# Patient Record
Sex: Female | Born: 1990 | Race: White | Hispanic: No | Marital: Married | Smoking: Never smoker
Health system: Southern US, Community
[De-identification: ages and names within clinical notes are randomized; demographics above are authoritative.]

## PROBLEM LIST (undated history)

## (undated) DIAGNOSIS — J45909 Unspecified asthma, uncomplicated: Secondary | ICD-10-CM

## (undated) DIAGNOSIS — G43909 Migraine, unspecified, not intractable, without status migrainosus: Secondary | ICD-10-CM

## (undated) HISTORY — DX: Migraine, unspecified, not intractable, without status migrainosus: G43.909

## (undated) HISTORY — DX: Unspecified asthma, uncomplicated: J45.909

---
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
## 2010-03-12 ENCOUNTER — Ambulatory Visit: Payer: Self-pay | Admitting: Family Medicine

## 2010-03-12 DIAGNOSIS — N946 Dysmenorrhea, unspecified: Secondary | ICD-10-CM | POA: Insufficient documentation

## 2010-03-12 DIAGNOSIS — G43109 Migraine with aura, not intractable, without status migrainosus: Secondary | ICD-10-CM | POA: Insufficient documentation

## 2010-03-12 LAB — CONVERTED CEMR LAB
Bilirubin Urine: NEGATIVE
Glucose, Urine, Semiquant: NEGATIVE
Ketones, urine, test strip: NEGATIVE
Nitrite: NEGATIVE
Protein, U semiquant: NEGATIVE
Specific Gravity, Urine: 1.015
Urobilinogen, UA: 0.2
WBC Urine, dipstick: NEGATIVE
pH: 6.5

## 2010-03-13 LAB — CONVERTED CEMR LAB
ALT: 12 units/L (ref 0–35)
AST: 18 units/L (ref 0–37)
Albumin: 4.4 g/dL (ref 3.5–5.2)
Alkaline Phosphatase: 46 units/L (ref 39–117)
BUN: 12 mg/dL (ref 6–23)
Basophils Absolute: 0 10*3/uL (ref 0.0–0.1)
Basophils Relative: 0.5 % (ref 0.0–3.0)
Bilirubin, Direct: 0.2 mg/dL (ref 0.0–0.3)
CO2: 28 meq/L (ref 19–32)
Calcium: 9.1 mg/dL (ref 8.4–10.5)
Chloride: 104 meq/L (ref 96–112)
Cholesterol: 114 mg/dL (ref 0–200)
Creatinine, Ser: 0.9 mg/dL (ref 0.4–1.2)
Eosinophils Absolute: 0.2 10*3/uL (ref 0.0–0.7)
Eosinophils Relative: 3.7 % (ref 0.0–5.0)
GFR calc non Af Amer: 86.04 mL/min (ref >60)
Glucose, Bld: 85 mg/dL (ref 70–99)
HCT: 39.4 % (ref 36.0–46.0)
HDL: 40.2 mg/dL (ref >39.00)
Hemoglobin: 13.6 g/dL (ref 12.0–15.0)
LDL Cholesterol: 68 mg/dL (ref 0–99)
Lymphocytes Relative: 24.1 % (ref 12.0–46.0)
Lymphs Abs: 1.4 10*3/uL (ref 0.7–4.0)
MCHC: 34.5 g/dL (ref 30.0–36.0)
MCV: 89.7 fL (ref 78.0–100.0)
Monocytes Absolute: 0.5 10*3/uL (ref 0.1–1.0)
Monocytes Relative: 8.7 % (ref 3.0–12.0)
Neutro Abs: 3.6 10*3/uL (ref 1.4–7.7)
Neutrophils Relative %: 63 % (ref 43.0–77.0)
Platelets: 245 10*3/uL (ref 150.0–400.0)
Potassium: 4.1 meq/L (ref 3.5–5.1)
RBC: 4.39 M/uL (ref 3.87–5.11)
RDW: 13.4 % (ref 11.5–14.6)
Sodium: 139 meq/L (ref 135–145)
TSH: 0.8 microintl units/mL (ref 0.35–5.50)
Total Bilirubin: 1.1 mg/dL (ref 0.3–1.2)
Total CHOL/HDL Ratio: 3
Total Protein: 7 g/dL (ref 6.0–8.3)
Triglycerides: 29 mg/dL (ref 0.0–149.0)
VLDL: 5.8 mg/dL (ref 0.0–40.0)
WBC: 5.7 10*3/uL (ref 4.5–10.5)

## 2010-03-25 ENCOUNTER — Encounter (INDEPENDENT_AMBULATORY_CARE_PROVIDER_SITE_OTHER): Payer: Self-pay | Admitting: *Deleted

## 2010-05-04 ENCOUNTER — Encounter: Payer: Self-pay | Admitting: Family Medicine

## 2010-11-17 NOTE — Assessment & Plan Note (Signed)
Summary: CPX for Nursing school/scm   Vital Signs:  Patient profile:   20 year old female Height:      65 inches Weight:      135 pounds BMI:     22.55 Pulse rate:   76 / minute Pulse rhythm:   regular Resp:     12 per minute BP sitting:   120 / 76  (left arm) Cuff size:   regular  Vitals Entered By: Army Fossa CMA (Mar 12, 2010 1:07 PM) CC: Pt here for CPX  Vision Screening:Left eye w/o correction: 20 / 15 Right Eye w/o correction: 20 / 13 Both eyes w/o correction:  20/ 10        Vision Entered By: Army Fossa CMA (Mar 12, 2010 1:20 PM)   History of Present Illness: Pt here for cpe and labs.  No complaints.  Pt needs forms filled out for [employer] [employer]- nursing school.  Pt had headache with ? aura on bcp so gyn stopped bcp until seen by neurologist.   No pap -- pt sees [facility] ob[facility]yn.  Preventive Screening-Counseling & Management  Alcohol-Tobacco     Alcohol drinks/day: 0     Smoking Status: never  Caffeine-Diet-Exercise     Caffeine use/day: 2     Does Patient Exercise: yes     Type of exercise: gym     Exercise (avg: min/session): 30-60     Times/week: <3  Hep-HIV-STD-Contraception     Dental Visit-last 6 months yes     Dental Care Counseling: not indicated; dental care within six months      Sexual History:  not sexually active.        Drug Use:  no.    Current Medications (verified): 1)  Ventolin Hfa 108 (90 Base) Mcg/act Aers (Albuterol Sulfate)  Allergies (verified): 1)  ! 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 Family History Diabetes 1st degree relative Family History High cholesterol Family History Hypertension  Social History: Reviewed history and no changes required. Single Never Smoked Alcohol use-no Drug use-no Regular exercise-yes Smoking Status:  never Drug Use:  no Does Patient Exercise:  yes Caffeine use/day:  2 Dental Care w/in 6 mos.:  yes Sexual History:  not sexually active  Review of Systems      See HPI General:  Denies chills, fatigue, fever, loss of appetite, malaise, sleep disorder, sweats, weakness, and weight loss. Eyes:  Denies blurring, discharge, double vision, eye irritation, eye pain, halos, itching, light sensitivity, red eye, vision loss-1 eye, and vision loss-both eyes; optho q1y. ENT:  Denies decreased hearing, difficulty swallowing, ear discharge, earache, hoarseness, nasal congestion, nosebleeds, postnasal drainage, ringing in ears, sinus pressure, and sore throat. CV:  Denies bluish discoloration of lips or nails, chest pain or discomfort, difficulty breathing at night, difficulty breathing while lying down, fainting, fatigue, leg cramps with exertion, lightheadness, near fainting, palpitations, shortness of breath with exertion, swelling of feet, swelling of hands, and weight gain. Resp:  Denies chest discomfort, chest pain with inspiration, cough,  coughing up blood, excessive snoring, hypersomnolence, morning headaches, pleuritic, shortness of breath, sputum productive, and wheezing. GI:  Denies abdominal pain, bloody stools, change in bowel habits, constipation, dark tarry stools, diarrhea, excessive appetite, gas, hemorrhoids, indigestion, loss of appetite, and nausea. GU:  Denies abnormal vaginal bleeding, decreased libido, discharge, dysuria,  genital sores, hematuria, incontinence, nocturia, urinary frequency, and urinary hesitancy. MS:  Denies joint pain, joint redness, joint swelling, loss of strength, low back pain, mid back pain, muscle aches, muscle , cramps, muscle weakness, stiffness, and thoracic pain. Derm:  Denies changes in color of skin, changes in nail beds, dryness, excessive perspiration, flushing, hair loss, insect bite(s), itching, lesion(s), poor wound healing, and rash; derm-- Dr Pablo Lawrence --[facility]. Neuro:  Denies brief paralysis, difficulty with concentration, disturbances in coordination, falling down, headaches, inability to speak, memory loss, numbness, poor balance, seizures, sensation of room spinning, tingling, tremors, visual disturbances, and weakness. Psych:  Denies alternate hallucination ( auditory/visual), anxiety, depression, easily angered, easily tearful, irritability, mental problems, panic attacks, sense of great danger, suicidal thoughts/plans, thoughts of violence, unusual visions or sounds, and thoughts /plans of harming others. Endo:  Denies cold intolerance, excessive hunger, excessive thirst, excessive urination, heat intolerance, polyuria, and weight change. Heme:  Denies abnormal bruising, bleeding, enlarge lymph nodes, fevers, pallor, and skin discoloration. Allergy:  Denies hives or rash, itching eyes, persistent infections, seasonal allergies, and sneezing.  Physical Exam  General:  Well-developed,well-nourished,in no acute distress; alert,appropriate and cooperative throughout examination Head:  Normocephalic and atraumatic without obvious abnormalities. No apparent alopecia or balding. Eyes:  vision grossly intact, pupils equal, pupils round, pupils reactive to light, and no injection.   Ears:  External ear exam shows no significant lesions or deformities.  Otoscopic examination reveals clear canals, tympanic membranes are intact bilaterally without bulging, retraction, inflammation or  discharge. Hearing is grossly normal bilaterally. Nose:  External nasal examination shows no deformity or inflammation. Nasal mucosa are pink and moist without lesions or exudates. Mouth:  Oral mucosa and oropharynx without lesions or exudates.  Teeth in good repair. Neck:  No deformities, masses, or tenderness noted. Chest Wall:  No deformities, masses, or tenderness noted. Breasts:  gyn Lungs:  Normal respiratory effort, chest expands symmetrically. Lungs are clear to auscultation, no crackles or wheezes. Heart:  Normal rate and regular rhythm. S1 and S2 normal without gallop, murmur, click, rub or other extra sounds. Abdomen:  Bowel sounds positive,abdomen soft and non-tender without masses, organomegaly or hernias noted. Genitalia:  gyn Msk:  normal ROM, no joint swelling, no joint warmth, no redness over joints, no joint instability, and no crepitation.   Pulses:  R posterior tibial normal, R dorsalis pedis normal, R carotid normal, L popliteal normal, L posterior tibial normal, and L dorsalis pedis normal.   Extremities:  No clubbing, cyanosis, edema, or deformity noted with normal full range of motion of all joints.   Neurologic:  No cranial nerve deficits noted. Station and gait are normal. Plantar reflexes are down-going bilaterally. DTRs are symmetrical throughout. Sensory, motor and coordinative functions appear intact. Skin:  Intact without suspicious lesions or rashes Cervical Nodes:  No lymphadenopathy noted Axillary Nodes:  No palpable lymphadenopathy Psych:  Cognition and judgment appear intact. Alert and cooperative with normal attention span and concentration. 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 Gravity: 1.015   (Normal Range: 1.003-1.035) Blood: moderate   (Normal Range: Negative) pH: 6.5   (Normal Range: 5.0-8.0) Protein: negative   (Normal Range: Negative) Urobilinogen: 0.2   (Normal Range: 0-1) Nitrite: negative   (Normal Range: Negative) Leukocyte Esterace: negative   (Normal Range: Negative)    Comments: Pt is on period. Army Fossa CMA  Mar 12, 2010 1:19 PM       Immunization History:  Tetanus/Td Immunization History:    Tetanus/Td:  Tdap (06/26/2008)  Influenza Immunization History:    Influenza:  Fluvax 3+ (07/20/2009)  PPD Status History:    PPD Status:  negative (01/04/2010)

## 2010-11-17 NOTE — Miscellaneous (Signed)
Summary: Vaccine Record  Vaccine Record   Imported By: Lanelle Bal 03/21/2010 08:38:57  _____________________________________________________________________  External Attachment:    Type:   Image     Comment:   External Document

## 2010-11-17 NOTE — Consult Note (Signed)
Summary: [facility]  [facility]   Imported By: Lanelle Bal 05/12/2010 09:40:46  _____________________________________________________________________  External Attachment:    Type:   Image     Comment:   External Document

## 2010-11-17 NOTE — Letter (Signed)
Summary: Primary Care Consult Scheduled Letter  [facility] [place]  [address] [place], [place] [address]   Phone: [phone]  Fax: [phone]      03/25/2010 MRN: [id number]  Selena Campbell [address] [place], [place]  [address]    Dear [name],      We have scheduled an appointment for you.  At the recommendation of Dr._Lowne_, we have scheduled you a consult with _Dr. Yan___ on _July 7, 2011_ at _10:45am__.  Their address is_[address] [place] [address]___. The office phone number is _[phone]__.  If this appointment day and time is not convenient for you, please feel free to call the office of the doctor you are being referred to at the number listed above and reschedule the appointment.     It is important for you to keep your scheduled appointments. We are here to make sure you are given good patient care. If you have questions or you have made changes to your appointment, please notify us at  [phone]  , ask for  [name]    .  Also please call and update your phone numbers, we were unable to reach you by phone.     Thank you,  Patient Care Coordinator [place] at [facility]

## 2016-10-05 ENCOUNTER — Emergency Department (HOSPITAL_BASED_OUTPATIENT_CLINIC_OR_DEPARTMENT_OTHER)
Admission: EM | Admit: 2016-10-05 | Discharge: 2016-10-06 | Disposition: A | Discharge status: Home / Self Care | Payer: 59 | Attending: Emergency Medicine | Admitting: Emergency Medicine

## 2016-10-05 ENCOUNTER — Encounter (HOSPITAL_BASED_OUTPATIENT_CLINIC_OR_DEPARTMENT_OTHER): Payer: Self-pay | Admitting: *Deleted

## 2016-10-05 ENCOUNTER — Emergency Department (HOSPITAL_BASED_OUTPATIENT_CLINIC_OR_DEPARTMENT_OTHER): Payer: 59

## 2016-10-05 DIAGNOSIS — Z8619 Personal history of other infectious and parasitic diseases: Secondary | ICD-10-CM | POA: Diagnosis not present

## 2016-10-05 DIAGNOSIS — R161 Splenomegaly, not elsewhere classified: Secondary | ICD-10-CM | POA: Insufficient documentation

## 2016-10-05 DIAGNOSIS — J45909 Unspecified asthma, uncomplicated: Secondary | ICD-10-CM | POA: Diagnosis not present

## 2016-10-05 DIAGNOSIS — R1011 Right upper quadrant pain: Secondary | ICD-10-CM

## 2016-10-05 LAB — CBC WITH DIFFERENTIAL/PLATELET
BASOS ABS: 0 10*3/uL (ref 0.0–0.1)
Basophils Relative: 0 %
EOS ABS: 0.1 10*3/uL (ref 0.0–0.7)
EOS PCT: 2 %
HCT: 35.9 % — ABNORMAL LOW (ref 36.0–46.0)
Hemoglobin: 12.3 g/dL (ref 12.0–15.0)
Lymphocytes Relative: 35 %
Lymphs Abs: 1.1 10*3/uL (ref 0.7–4.0)
MCH: 28.1 pg (ref 26.0–34.0)
MCHC: 34.3 g/dL (ref 30.0–36.0)
MCV: 82.2 fL (ref 78.0–100.0)
Monocytes Absolute: 0.5 10*3/uL (ref 0.1–1.0)
Monocytes Relative: 15 %
Neutro Abs: 1.5 10*3/uL — ABNORMAL LOW (ref 1.7–7.7)
Neutrophils Relative %: 48 %
PLATELETS: 202 10*3/uL (ref 150–400)
RBC: 4.37 MIL/uL (ref 3.87–5.11)
RDW: 13 % (ref 11.5–15.5)
WBC: 3.1 10*3/uL — AB (ref 4.0–10.5)

## 2016-10-05 LAB — PREGNANCY, URINE: PREG TEST UR: NEGATIVE

## 2016-10-05 LAB — COMPREHENSIVE METABOLIC PANEL
ALBUMIN: 4.3 g/dL (ref 3.5–5.0)
ALT: 16 U/L (ref 14–54)
AST: 20 U/L (ref 15–41)
Alkaline Phosphatase: 54 U/L (ref 38–126)
Anion gap: 8 (ref 5–15)
BUN: 6 mg/dL (ref 6–20)
CHLORIDE: 103 mmol/L (ref 101–111)
CO2: 24 mmol/L (ref 22–32)
CREATININE: 0.71 mg/dL (ref 0.44–1.00)
Calcium: 9 mg/dL (ref 8.9–10.3)
GFR calc non Af Amer: >60 mL/min (ref >60)
Glucose, Bld: 96 mg/dL (ref 65–99)
Potassium: 3.5 mmol/L (ref 3.5–5.1)
SODIUM: 135 mmol/L (ref 135–145)
Total Bilirubin: 1.4 mg/dL — ABNORMAL HIGH (ref 0.3–1.2)
Total Protein: 7 g/dL (ref 6.5–8.1)

## 2016-10-05 LAB — LIPASE, BLOOD: Lipase: 29 U/L (ref 11–51)

## 2016-10-05 MED ORDER — DICYCLOMINE HCL 20 MG PO TABS: 20.0000 mg | ORAL_TABLET | Freq: Two times a day (BID) | ORAL | 0 refills | Status: DC

## 2016-10-05 MED ORDER — NAPROXEN 500 MG PO TABS: 500.0000 mg | ORAL_TABLET | Freq: Two times a day (BID) | ORAL | 0 refills | Status: DC

## 2016-10-05 MED ORDER — SODIUM CHLORIDE 0.9 % IV BOLUS (SEPSIS)
1000.0000 mL | Freq: Once | INTRAVENOUS | Status: AC
  Administered 2016-10-05: 1000 mL via INTRAVENOUS

## 2016-10-05 MED ORDER — GI COCKTAIL ~~LOC~~
30.0000 mL | Freq: Once | ORAL | Status: AC
  Administered 2016-10-05: 30 mL via ORAL
  Filled 2016-10-05: qty 30

## 2016-10-05 MED ORDER — ONDANSETRON HCL 4 MG/2ML IJ SOLN
4.0000 mg | Freq: Once | INTRAMUSCULAR | Status: AC
  Administered 2016-10-05: 4 mg via INTRAVENOUS
  Filled 2016-10-05: qty 2

## 2016-10-05 MED ORDER — PROMETHAZINE HCL 25 MG PO TABS: 25.0000 mg | ORAL_TABLET | Freq: Four times a day (QID) | ORAL | 0 refills | Status: DC | PRN

## 2016-10-05 NOTE — ED Triage Notes (Signed)
Pt reports that she has had nausea last night.  States that she has not had fever.  Reports right shoulder pain.  [facility] for [facility].  Pt on her period now-states that she was 5 days late starting and that she has a 499 month old.  Ambulatory.

## 2016-10-05 NOTE — Discharge Instructions (Signed)
Follow-up with your primary care doctor for further evaluation of your symptoms. You may take Bentyl as prescribed for abdominal pain and spasms as well as Phenergan for nausea, as needed. Your ultrasound today did not show any abnormalities with your gallbladder. It did show an enlarged spleen. This may be from your history of mononucleosis. Your electrolytes, liver function, and kidney function were reassuring. Return to the Emergency Department if you experience new or concerning symptoms or worsening of your pain.

## 2016-10-05 NOTE — ED Provider Notes (Signed)
[facility] Provider Note   CSN: [id number] Arrival date & time: 10/05/16  2038  By signing my name below, I, Clovis PuAvnee Patel, attest that this documentation has been prepared under the direction and in the presence of  [employer]Kelly Eilan Mcinerny, PA-C. Electronically Signed: Clovis PuAvnee Patel, ED Scribe. 10/05/16. 9:48 PM.   History   Chief Complaint Chief Complaint  Patient presents with  . Abdominal Pain    The history is provided by the patient. No language interpreter was used.   HPI Comments:  Selena Campbell is a 25 y.o. female who presents to the Emergency Department complaining of sudden onset, worsening, right sided abdominal pain x yesterday. Pt states her pain worsened after eating fried squash. She also reports nausea, body aches and right shoulder pain. She visited Urgent Care at 7 PM today and was referred to the ED. Pt denies diarrhea, blood in her stool, vomiting, fevers any other associated symptoms and modifying factors at this time. She notes a family hx of gallbladder issues with aunt, maternal grandmother and paternal grandmother. Pt is currently on the last day of her menstrual cycle which was a couple of days late.     History reviewed. No pertinent past medical history.  Patient Active Problem List   Diagnosis Date Noted  . MIGRAINE WITH AURA 03/12/2010  . DYSMENORRHEA 03/12/2010    Past Surgical History:  Procedure Laterality Date  . CESAREAN SECTION      OB History    No data available       Home Medications    Prior to Admission medications   Medication Sig Start Date End Date Taking? Authorizing Provider  dicyclomine (BENTYL) 20 MG tablet Take 1 tablet (20 mg total) by mouth 2 (two) times daily. Take, as needed for abdominal pain/cramping 10/05/16   Antony MaduraKelly Ruthanna Macchia, PA-C  naproxen (NAPROSYN) 500 MG tablet Take 1 tablet (500 mg total) by mouth 2 (two) times daily. 10/05/16   Antony MaduraKelly Ellyce Lafevers, PA-C  promethazine (PHENERGAN) 25 MG tablet Take 1 tablet (25 mg total)  by mouth every 6 (six) hours as needed for nausea or vomiting. 10/05/16   Antony MaduraKelly Arria Naim, PA-C    Family History History reviewed. No pertinent family history.  Social History Social History  Substance Use Topics  . Smoking status: Never Smoker  . Smokeless tobacco: Never Used  . Alcohol use No     Allergies   Prednisone   Review of Systems Review of Systems 10 systems reviewed and all are negative for acute change except as noted in the HPI.   Physical Exam Updated Vital Signs BP 104/65 (BP Location: Left Arm)   Pulse 75   Temp 98.8 F (37.1 C) (Oral)   Resp 18   Ht 5\' 6"  (1.676 m)   Wt 59 kg   LMP 09/30/2016   SpO2 98%   BMI 20.98 kg/m   Physical Exam  Constitutional: She is oriented to person, place, and time. She appears well-developed and well-nourished. No distress.  Nontoxic and in no distress  HENT:  Head: Normocephalic and atraumatic.  Eyes: Conjunctivae and EOM are normal. No scleral icterus.  Neck: Normal range of motion.  Cardiovascular: Normal rate, regular rhythm and intact distal pulses.   Pulmonary/Chest: Effort normal. No respiratory distress. She has no wheezes. She has no rales.  Lungs clear to auscultation bilaterally  Abdominal: Soft. She exhibits no distension and no mass. There is tenderness. There is no rebound.  Soft, nondistended abdomen. There is focal right upper quadrant  tenderness. Negative Murphy sign. There is also mild suprapubic tenderness. No masses or peritoneal signs.   Musculoskeletal: Normal range of motion.  Neurological: She is alert and oriented to person, place, and time. She exhibits normal muscle tone. Coordination normal.  GCS 15. Patient moving all extremities.  Skin: Skin is warm and dry. No rash noted. She is not diaphoretic. No erythema. No pallor.  Psychiatric: She has a normal mood and affect. Her behavior is normal.  Nursing note and vitals reviewed.    ED Treatments / Results  DIAGNOSTIC STUDIES:  Oxygen  Saturation is 100% on RA, normal by my interpretation.    COORDINATION OF CARE:  9:47 PM Discussed treatment plan with pt at bedside and pt agreed to plan.\  Labs (all labs ordered are listed, but only abnormal results are displayed) Labs Reviewed  CBC WITH DIFFERENTIAL/PLATELET - Abnormal; Notable for the following:       Result Value   WBC 3.1 (*)    HCT 35.9 (*)    Neutro Abs 1.5 (*)    All other components within normal limits  COMPREHENSIVE METABOLIC PANEL - Abnormal; Notable for the following:    Total Bilirubin 1.4 (*)    All other components within normal limits  LIPASE, BLOOD  PREGNANCY, URINE    EKG  EKG Interpretation None       Radiology [place] Abdomen Complete  Result Date: 10/05/2016 CLINICAL DATA:  Nausea vomiting and right upper quadrant pain for 2 days EXAM: ABDOMEN ULTRASOUND COMPLETE COMPARISON:  None. FINDINGS: Gallbladder: No gallstones or wall thickening visualized. No sonographic Murphy sign noted by sonographer. Common bile duct: Diameter: 2.1 mm Liver: No focal lesion identified. Within normal limits in parenchymal echogenicity. IVC: No abnormality visualized. Pancreas: Visualized portion unremarkable. Spleen: Enlarged at 13 cm with volume of 424 mL Right Kidney: Length: 10.6 cm. Echogenicity within normal limits. No mass or hydronephrosis visualized. Left Kidney: Length: 10.3 cm. Echogenicity within normal limits. No mass or hydronephrosis visualized. Abdominal aorta: No aneurysm visualized. Other findings: None. IMPRESSION: 1. Enlarged spleen. 2. Otherwise negative abdominal ultrasound Electronically Signed   By: Jasmine PangKim  Fujinaga M.D.   On: 10/05/2016 22:33    Procedures Procedures (including critical care time)  Medications Ordered in ED Medications  gi cocktail (Maalox,Lidocaine,Donnatal) (30 mLs Oral Given 10/05/16 2229)  sodium chloride 0.9 % bolus 1,000 mL (0 mLs Intravenous Stopped 10/06/16 0001)  ondansetron (ZOFRAN) injection 4 mg (4 mg  Intravenous Given 10/05/16 2229)     Initial Impression / Assessment and Plan / ED Course  I have reviewed the triage vital signs and the nursing notes.  Pertinent labs & imaging results that were available during my care of the patient were reviewed by me and considered in my medical decision making (see chart for details).  Clinical Course     25 year old female presents to the emergency department for evaluation of intermittent right upper quadrant abdominal pain. Symptoms worsened yesterday evening and were largely resolved by the morning; however, patient has continued to have a general abdominal ache. No signs of acute surgical abdomen on exam. No vomiting, diarrhea, melena, or hematochezia. Patient is afebrile today. She has a mild leukopenia. Liver and kidney function preserved.  Given focal right upper quadrant tenderness, ultrasound obtained. This shows splenomegaly only. No other acute or concerning process. No evidence of acute cholecystitis. Patient reports a history of mononucleosis. It is possible that her splenomegaly is chronic. I have recommended that she follow-up with her primary care doctor to  have this reassessed. Patient did have some mild suprapubic tenderness. This is suspected to be secondary to her menstrual cycle. She has no complaints of lower abdominal pain. Abdomen is stable on repeat assessment. I do not believe further emergent workup is indicated. Patient discharged with instructions for supportive care. Return precautions discussed and provided. Patient discharged in satisfactory condition with no unaddressed concerns.   Final Clinical Impressions(s) / ED Diagnoses   Final diagnoses:  Right upper quadrant abdominal pain  Splenomegaly  History of Epstein-Barr virus infection    New Prescriptions Discharge Medication List as of 10/05/2016 11:36 PM    START taking these medications   Details  dicyclomine (BENTYL) 20 MG tablet Take 1 tablet (20 mg total)  by mouth 2 (two) times daily. Take, as needed for abdominal pain/cramping, Starting Tue 10/05/2016, Print    naproxen (NAPROSYN) 500 MG tablet Take 1 tablet (500 mg total) by mouth 2 (two) times daily., Starting Tue 10/05/2016, Print    promethazine (PHENERGAN) 25 MG tablet Take 1 tablet (25 mg total) by mouth every 6 (six) hours as needed for nausea or vomiting., Starting Tue 10/05/2016, Print        I personally performed the services described in this documentation, which was scribed in my presence. The recorded information has been reviewed and is accurate.       Antony Madura, PA-C 10/06/16 0049    Melene Plan, DO 10/06/16 2121

## 2016-10-08 ENCOUNTER — Encounter: Payer: Self-pay | Admitting: Family

## 2016-10-08 ENCOUNTER — Ambulatory Visit (INDEPENDENT_AMBULATORY_CARE_PROVIDER_SITE_OTHER): Payer: 59 | Admitting: Family

## 2016-10-08 VITALS — BP 110/66 | HR 93 | Temp 99.7°F | Resp 16 | Ht 66.0 in | Wt 127.2 lb

## 2016-10-08 DIAGNOSIS — Z23 Encounter for immunization: Secondary | ICD-10-CM

## 2016-10-08 DIAGNOSIS — R161 Splenomegaly, not elsewhere classified: Secondary | ICD-10-CM | POA: Diagnosis not present

## 2016-10-08 LAB — MONONUCLEOSIS SCREEN: MONO SCREEN: NEGATIVE

## 2016-10-08 NOTE — Patient Instructions (Addendum)
Please call if your abdominal symptoms do not continue to improve.  Schedule a complete physical at your convenience.      Influenza (Flu) Vaccine (Inactivated or Recombinant): What You Need to Know 1. Why get vaccinated? Influenza ("flu") is a contagious disease that spreads around the [place] every year, usually between October and May. Flu is caused by influenza viruses, and is spread mainly by coughing, sneezing, and close contact. Anyone can get flu. Flu strikes suddenly and can last several days. Symptoms vary by age, but can include:  fever/chills  sore throat  muscle aches  fatigue  cough  headache  runny or stuffy nose Flu can also lead to pneumonia and blood infections, and cause diarrhea and seizures in children. If you have a medical condition, such as heart or lung disease, flu can make it worse. Flu is more dangerous for some people. Infants and young children, people 27 years of age and older, pregnant women, and people with certain health conditions or a weakened immune system are at greatest risk. Each year thousands of people in the [place] die from flu, and many more are hospitalized. Flu vaccine can:  keep you from getting flu,  make flu less severe if you do get it, and  keep you from spreading flu to your family and other people. 2. Inactivated and recombinant flu vaccines A dose of flu vaccine is recommended every flu season. Children 6 months through 968 years of age may need two doses during the same flu season. Everyone else needs only one dose each flu season. Some inactivated flu vaccines contain a very small amount of a mercury-based preservative called thimerosal. Studies have not shown thimerosal in vaccines to be harmful, but flu vaccines that do not contain thimerosal are available. There is no live flu virus in flu shots. They cannot cause the flu. There are many flu viruses, and they are always changing. Each year a new flu vaccine  is made to protect against three or four viruses that are likely to cause disease in the upcoming flu season. But even when the vaccine doesn't exactly match these viruses, it may still provide some protection. Flu vaccine cannot prevent:  flu that is caused by a virus not covered by the vaccine, or  illnesses that look like flu but are not. It takes about 2 weeks for protection to develop after vaccination, and protection lasts through the flu season. 3. Some people should not get this vaccine Tell the person who is giving you the vaccine:  If you have any severe, life-threatening allergies. If you ever had a life-threatening allergic reaction after a dose of flu vaccine, or have a severe allergy to any part of this vaccine, you may be advised not to get vaccinated. Most, but not all, types of flu vaccine contain a small amount of egg protein.  If you ever had Guillain-Barr Syndrome (also called GBS). Some people with a history of GBS should not get this vaccine. This should be discussed with your doctor.  If you are not feeling well. It is usually okay to get flu vaccine when you have a mild illness, but you might be asked to come back when you feel better. 4. Risks of a vaccine reaction With any medicine, including vaccines, there is a chance of reactions. These are usually mild and go away on their own, but serious reactions are also possible. Most people who get a flu shot do not have any problems with it.  Minor problems following a flu shot include:  soreness, redness, or swelling where the shot was given  hoarseness  sore, red or itchy eyes  cough  fever  aches  headache  itching  fatigue If these problems occur, they usually begin soon after the shot and last 1 or 2 days. More serious problems following a flu shot can include the following:  There may be a small increased risk of Guillain-Barre Syndrome (GBS) after inactivated flu vaccine. This risk has been estimated  at 1 or 2 additional cases per million people vaccinated. This is much lower than the risk of severe complications from flu, which can be prevented by flu vaccine.  Young children who get the flu shot along with pneumococcal vaccine (PCV13) and/or DTaP vaccine at the same time might be slightly more likely to have a seizure caused by fever. Ask your doctor for more information. Tell your doctor if a child who is getting flu vaccine has ever had a seizure. Problems that could happen after any injected vaccine:  People sometimes faint after a medical procedure, including vaccination. Sitting or lying down for about 15 minutes can help prevent fainting, and injuries caused by a fall. Tell your doctor if you feel dizzy, or have vision changes or ringing in the ears.  Some people get severe pain in the shoulder and have difficulty moving the arm where a shot was given. This happens very rarely.  Any medication can cause a severe allergic reaction. Such reactions from a vaccine are very rare, estimated at about 1 in a million doses, and would happen within a few minutes to a few hours after the vaccination. As with any medicine, there is a very remote chance of a vaccine causing a serious injury or death. The safety of vaccines is always being monitored. For more information, visit: http://floyd.org/www.cdc.gov/vaccinesafety/ 5. What if there is a serious reaction? What should I look for? Look for anything that concerns you, such as signs of a severe allergic reaction, very high fever, or unusual behavior. Signs of a severe allergic reaction can include hives, swelling of the face and throat, difficulty breathing, a fast heartbeat, dizziness, and weakness. These would start a few minutes to a few hours after the vaccination. What should I do?  If you think it is a severe allergic reaction or other emergency that can't wait, call 9-1-1 and get the person to the nearest hospital. Otherwise, call your doctor.  Reactions  should be reported to the Vaccine Adverse Event Reporting System (VAERS). Your doctor should file this report, or you can do it yourself through the VAERS web site at www.vaers.LAgents.nohhs.gov, or by calling 1-(401)357-3067.  VAERS does not give medical advice. 6. The National Vaccine Injury Compensation Program The [employer] Vaccine Injury Compensation Program (VICP) is a federal program that was created to compensate people who may have been injured by certain vaccines. Persons who believe they may have been injured by a vaccine can learn about the program and about filing a claim by calling 1-778-212-4411 or visiting the VICP website at SpiritualWord.atwww.hrsa.gov/vaccinecompensation. There is a time limit to file a claim for compensation. 7. How can I learn more?  Ask your healthcare provider. He or she can give you the vaccine package insert or suggest other sources of information.  Call your local or state health department.  Contact the Centers for Disease Control and Prevention (CDC):  Call 225-003-03271-503-246-7586 (1-800-CDC-INFO) or  Visit CDC's website at BiotechRoom.com.cywww.cdc.gov/flu Vaccine Information Statement, Inactivated Influenza Vaccine (  CDC's website at www.cdc.gov/flu Vaccine Information Statement, Inactivated Influenza Vaccine (05/24/2014) This information is not intended to replace advice given to you by your health care provider. Make sure you discuss any questions you have with your health care provider. Document Released: 07/29/2006 Document Revised: 06/24/2016 Document Reviewed: 06/24/2016 Elsevier Interactive Patient Education  2017 Elsevier Inc.  

## 2016-10-08 NOTE — Progress Notes (Signed)
Subjective:    Patient ID: Selena Campbell, female    DOB: 1991-02-14, 25 y.o.   MRN: [id number]  HPI  [name] is a 25 yr old female who presents today to establish care.    ED record is reviewed.  She presented with R sided abdominal pain.  She had mild elevation of her total bilirubin. Platelets were normal. She reports that she had mono at age 89.  She reports that her abdominal pain is improved.  Ate solids for the first time yesterday.  She reports that she had sweating yesterday.  She notes that over the past few weeks she has had fatigue, but she has a 219 month old who "doesn't sleep."    Migraines- reports hx of complicated migraines.  Reports that she has not had any recent migraines  Hx of childhood asthma- only uses an inhaler now if she is sick.   Review of Systems  Constitutional: Negative for unexpected weight change.  HENT: Negative for hearing loss and rhinorrhea.   Eyes: Negative for visual disturbance.  Respiratory: Negative for cough and shortness of breath.   Cardiovascular: Negative for chest pain and leg swelling.  Gastrointestinal: Negative for blood in stool, constipation, diarrhea and nausea.  Genitourinary: Negative for dysuria and frequency.  Musculoskeletal: Negative for arthralgias and myalgias.  Skin: Negative for rash.  Neurological: Negative for headaches.  Hematological: Negative for adenopathy.  Psychiatric/Behavioral:       Denies depression/anxiety   Past Medical History:  Diagnosis Date  . Asthma   . Migraines      Social History   Social History  . Marital status: Married    Spouse name: N/A  . Number of children: N/A  . Years of education: N/A   Occupational History  . nurse    Social History Main Topics  . Smoking status: Never Smoker  . Smokeless tobacco: Never Used  . Alcohol use No  . Drug use: No  . Sexual activity: Not on file   Other Topics Concern  . 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   No pets    Past Surgical History:  Procedure Laterality Date  . CESAREAN SECTION     05/06/14 and 01/05/16    Family History  Problem Relation Age of Onset  . Arthritis Mother   . Hyperlipidemia Father   . Diabetes Father   . Arthritis Maternal Grandmother   . Arthritis Paternal Grandmother   . Diabetes Paternal Grandmother     Allergies  Allergen Reactions  . Prednisone     No current outpatient prescriptions on file prior to visit.   No current facility-administered medications on file prior to visit.     BP 110/66 (BP Location: Right Arm, Cuff Size: Normal)   Pulse 93   Temp 99.7 F (37.6 C) (Oral)   Resp 16   Ht 5\' 6"  (1.676 m)   Wt 127 lb 3.2 oz (57.7 kg)   LMP 09/30/2016   SpO2 100%   BMI 20.53 kg/m       Objective:   Physical Exam  Constitutional: She is oriented to person, place, and time. She appears well-developed and well-nourished.  HENT:  Head: Normocephalic and atraumatic.  Right Ear: Tympanic membrane and ear canal normal.  Left Ear: Tympanic membrane and ear canal normal.  Mouth/Throat: No oropharyngeal exudate, posterior oropharyngeal edema or posterior oropharyngeal erythema.  Cardiovascular: Normal rate, regular rhythm and normal heart sounds.   No murmur heard. Pulmonary/Chest: Effort normal and breath sounds normal. No respiratory distress. She has no wheezes.  Abdominal: Soft. Bowel sounds are normal.  Mild tenderness of spleen with out palpable splenomegaly.   Neurological: She is alert and oriented to person, place, and time.  Skin: Skin is warm and dry.  Psychiatric: She has a normal mood and affect. Her behavior is normal. Judgment and thought content normal.          Assessment & Plan:  Splenomegaly- spleen size is 13 cm (normal is 11cm).  Resolving mononucleosis is certainly  a possibility. Will obtain EBV panel to further assess.  Pt is advised to call if abdominal discomfort worsens or if it fails to improve.

## 2016-10-08 NOTE — Progress Notes (Signed)
Pre visit review using our clinic review tool, if applicable. No additional management support is needed unless otherwise documented below in the visit note. 

## 2016-10-12 LAB — EPSTEIN-BARR VIRUS VCA ANTIBODY PANEL: EBV VCA IgG: <18 U/mL

## 2016-11-17 ENCOUNTER — Encounter: Payer: 59 | Admitting: Family

## 2016-12-01 ENCOUNTER — Encounter: Payer: 59 | Admitting: Family

## 2017-05-13 ENCOUNTER — Encounter: Payer: Self-pay | Admitting: Family

## 2017-05-13 ENCOUNTER — Ambulatory Visit (INDEPENDENT_AMBULATORY_CARE_PROVIDER_SITE_OTHER): Payer: 59 | Admitting: Family

## 2017-05-13 VITALS — BP 99/68 | HR 89 | Temp 98.8°F | Ht 66.0 in | Wt 128.6 lb

## 2017-05-13 DIAGNOSIS — Z0000 Encounter for general adult medical examination without abnormal findings: Secondary | ICD-10-CM | POA: Diagnosis not present

## 2017-05-13 LAB — HEPATIC FUNCTION PANEL
ALBUMIN: 4.2 g/dL (ref 3.5–5.2)
ALK PHOS: 41 U/L (ref 39–117)
ALT: 10 U/L (ref 0–35)
AST: 15 U/L (ref 0–37)
BILIRUBIN DIRECT: 0.3 mg/dL (ref 0.0–0.3)
BILIRUBIN TOTAL: 1.5 mg/dL — AB (ref 0.2–1.2)
Total Protein: 7 g/dL (ref 6.0–8.3)

## 2017-05-13 LAB — URINALYSIS, ROUTINE W REFLEX MICROSCOPIC
Bilirubin Urine: NEGATIVE
Ketones, ur: NEGATIVE
LEUKOCYTES UA: NEGATIVE
Nitrite: NEGATIVE
Specific Gravity, Urine: 1.015 (ref 1.000–1.030)
Total Protein, Urine: NEGATIVE
URINE GLUCOSE: NEGATIVE
Urobilinogen, UA: 0.2 (ref 0.0–1.0)
WBC, UA: NONE SEEN (ref 0–?)
pH: 7 (ref 5.0–8.0)

## 2017-05-13 LAB — BASIC METABOLIC PANEL
BUN: 14 mg/dL (ref 6–23)
CALCIUM: 9.4 mg/dL (ref 8.4–10.5)
CO2: 30 meq/L (ref 19–32)
CREATININE: 0.86 mg/dL (ref 0.40–1.20)
Chloride: 103 mEq/L (ref 96–112)
GFR: 83.28 mL/min (ref >60.00)
GLUCOSE: 84 mg/dL (ref 70–99)
Potassium: 4.4 mEq/L (ref 3.5–5.1)
SODIUM: 138 meq/L (ref 135–145)

## 2017-05-13 LAB — LIPID PANEL
CHOL/HDL RATIO: 2
Cholesterol: 120 mg/dL (ref 0–200)
HDL: 48.4 mg/dL (ref >39.00)
LDL CALC: 66 mg/dL (ref 0–99)
NONHDL: 71.57
Triglycerides: 28 mg/dL (ref 0.0–149.0)
VLDL: 5.6 mg/dL (ref 0.0–40.0)

## 2017-05-13 LAB — TSH: TSH: 1.06 u[IU]/mL (ref 0.35–4.50)

## 2017-05-13 LAB — CBC WITH DIFFERENTIAL/PLATELET
BASOS ABS: 0 10*3/uL (ref 0.0–0.1)
Basophils Relative: 0.6 % (ref 0.0–3.0)
EOS ABS: 0.1 10*3/uL (ref 0.0–0.7)
Eosinophils Relative: 1.7 % (ref 0.0–5.0)
HCT: 39.1 % (ref 36.0–46.0)
Hemoglobin: 13 g/dL (ref 12.0–15.0)
LYMPHS ABS: 1.9 10*3/uL (ref 0.7–4.0)
Lymphocytes Relative: 44.1 % (ref 12.0–46.0)
MCHC: 33.3 g/dL (ref 30.0–36.0)
MCV: 85.8 fl (ref 78.0–100.0)
MONO ABS: 0.5 10*3/uL (ref 0.1–1.0)
MONOS PCT: 10.6 % (ref 3.0–12.0)
NEUTROS ABS: 1.8 10*3/uL (ref 1.4–7.7)
NEUTROS PCT: 43 % (ref 43.0–77.0)
PLATELETS: 236 10*3/uL (ref 150.0–400.0)
RBC: 4.55 Mil/uL (ref 3.87–5.11)
RDW: 13.7 % (ref 11.5–15.5)
WBC: 4.3 10*3/uL (ref 4.0–10.5)

## 2017-05-13 NOTE — Patient Instructions (Addendum)
Try to add in 30 minutes of cardio 5 days a week.  Continue to work on [employer] diet.  Keep your upcoming appointment with GYN.

## 2017-05-13 NOTE — Progress Notes (Signed)
Subjective:    Patient ID: Selena Campbell, female    DOB: 09/27/1991, 26 y.o.   MRN: [id number]  HPI  Patient presents today for complete physical.  Immunizations: tetanus 2015  Diet:  Diet is generally healthy Wt Readings from Last 3 Encounters:  05/13/17 128 lb 9.6 oz (58.3 kg)  10/08/16 127 lb 3.2 oz (57.7 kg)  10/05/16 130 lb (59 kg)  Exercise: walking Pap Smear: scheduled with GYN Dental: due Vision: up to date 6/18     Review of Systems  Constitutional: Negative for unexpected weight change.  HENT: Negative for rhinorrhea.   Eyes: Negative for visual disturbance.  Respiratory: Negative for cough and shortness of breath.   Cardiovascular: Negative for chest pain and leg swelling.  Gastrointestinal: Negative for constipation and diarrhea.  Genitourinary: Negative for dysuria and frequency.       Reports irregular periods  Musculoskeletal: Negative for arthralgias and myalgias.  Skin: Negative for rash.  Neurological: Positive for headaches.  Hematological: Negative for adenopathy.  Psychiatric/Behavioral:       Denies depression/anxiety   Past Medical History:  Diagnosis Date  . Asthma   . Migraines      Social History   Social History  . Marital status: Married    Spouse name: N/A  . Number of children: N/A  . Years of education: N/A   Occupational History  . nurse    Social History Main Topics  . Smoking status: Never Smoker  . Smokeless tobacco: Never Used  . Alcohol use No  . Drug use: No  . Sexual activity: Yes    Partners: Male   Other Topics Concern  . 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   No pets    Past Surgical History:  Procedure Laterality Date  . CESAREAN SECTION     05/06/14 and 01/05/16    Family History  Problem Relation Age of Onset  . Arthritis  Mother        osteoarthritis  . Hyperlipidemia Father   . Diabetes Father        type 2 DM, he is obese  . Arthritis Maternal Grandmother   . Arthritis Paternal Grandmother   . Diabetes Paternal Grandmother   . Gestational diabetes Sister        overweight    Allergies  Allergen Reactions  . Prednisone Nausea And Vomiting  . Pseudoephedrine Other (See Comments)    "My heart races"    No current outpatient prescriptions on file prior to visit.   No current facility-administered medications on file prior to visit.     BP 99/68   Pulse 89   Temp 98.8 F (37.1 C) (Oral)   Ht 5\' 6"  (1.676 m)   Wt 128 lb 9.6 oz (58.3 kg)   LMP 04/27/2017   SpO2 100%   BMI 20.76 kg/m       Objective:   Physical Exam  Physical Exam  Constitutional: She is oriented to person, place, and time. She appears well-developed and well-nourished. No distress.  HENT:  Head: Normocephalic and atraumatic.  Right Ear: Tympanic membrane and ear canal normal.  Left Ear: Tympanic membrane and ear canal normal.  Mouth/Throat: Oropharynx is clear and moist.  Eyes: Pupils are equal, round, and reactive to  light. No scleral icterus.  Neck: Normal range of motion. No thyromegaly present.  Cardiovascular: Normal rate and regular rhythm.   No murmur heard. Pulmonary/Chest: Effort normal and breath sounds normal. No respiratory distress. He has no wheezes. She has no rales. She exhibits no tenderness.  Abdominal: Soft. Bowel sounds are normal. She exhibits no distension and no mass. There is no tenderness. There is no rebound and no guarding.  Musculoskeletal: She exhibits no edema.  Lymphadenopathy:    She has no cervical adenopathy.  Neurological: She is alert and oriented to person, place, and time.  She exhibits normal muscle tone. Coordination normal.  Skin: Skin is warm and dry.  Psychiatric: She has a normal mood and affect. Her behavior is normal. Judgment and thought content normal.  Breasts:  Examined lying Right: Without masses, retractions, discharge or axillary adenopathy.  Left: Without masses, retractions, discharge or axillary adenopathy.  Pelvic: deferred to GYN          Assessment & Plan:         Assessment & Plan:  Preventative care- discussed healthy diet and regular exercise. Immunizations reviewed and up-to-date. Will obtain routine lab work. She will have Pap performed with GYN.

## 2017-05-16 ENCOUNTER — Encounter: Payer: Self-pay | Admitting: *Deleted

## 2017-05-16 ENCOUNTER — Other Ambulatory Visit: Payer: Self-pay | Admitting: Family

## 2017-05-16 DIAGNOSIS — R3129 Other microscopic hematuria: Secondary | ICD-10-CM

## 2017-05-30 NOTE — Telephone Encounter (Signed)
Notified pt of above labs and she voices understanding. Reports that she was not having any menstrual bleeding at last visit. Pt scheduled lab appt for 06/08/17 as she just started her menses on 05/28/17. Also wanted us to know that she has an appt with GYN to discuss menses concerns. Future lab order entered.

## 2017-06-08 ENCOUNTER — Other Ambulatory Visit (INDEPENDENT_AMBULATORY_CARE_PROVIDER_SITE_OTHER): Payer: 59

## 2017-06-08 DIAGNOSIS — R3129 Other microscopic hematuria: Secondary | ICD-10-CM

## 2017-06-08 LAB — URINALYSIS, ROUTINE W REFLEX MICROSCOPIC
BILIRUBIN URINE: NEGATIVE
Ketones, ur: NEGATIVE
LEUKOCYTES UA: NEGATIVE
NITRITE: NEGATIVE
PH: 7.5 (ref 5.0–8.0)
Specific Gravity, Urine: 1.015 (ref 1.000–1.030)
Urine Glucose: NEGATIVE
Urobilinogen, UA: 1 (ref 0.0–1.0)

## 2018-02-20 ENCOUNTER — Encounter: Payer: Self-pay | Admitting: Family

## 2018-02-22 ENCOUNTER — Ambulatory Visit (INDEPENDENT_AMBULATORY_CARE_PROVIDER_SITE_OTHER): Payer: 59 | Admitting: Family

## 2018-02-22 VITALS — BP 108/66 | HR 75 | Temp 98.7°F | Resp 16 | Ht 66.0 in | Wt 131.8 lb

## 2018-02-22 DIAGNOSIS — M79621 Pain in right upper arm: Secondary | ICD-10-CM | POA: Diagnosis not present

## 2018-02-22 MED ORDER — MELOXICAM 7.5 MG PO TABS: 7.5000 mg | ORAL_TABLET | Freq: Every day | ORAL | 0 refills | Status: AC

## 2018-02-22 NOTE — Patient Instructions (Signed)
Please begin meloxicam (anti-inflammatory). Let me know if the pain worsens or if not resolved in 2 weeks and we will refer you for an ultrasound.

## 2018-02-22 NOTE — Progress Notes (Signed)
Subjective:    Patient ID: Selena Campbell, female    DOB: Dec 02, 1990, 27 y.o.   MRN: [id number]  HPI   [name] is a 27 yr old female who presents today with chief complaint of pain in the right axilla. Symptoms have been present for 2 months. Initially thought she felt a lymph node but now she no longer feels a "lump."  Review of Systems  See HPI  Past Medical History:  Diagnosis Date  . Asthma   . Migraines      Social History   Socioeconomic History  . Marital status: Married    Spouse name: Not on file  . Number of children: Not on file  . Years of education: Not on file  . Highest education level: Not on file  Occupational History  . Occupation: nurse  Social Needs  . Financial resource strain: Not on file  . Food insecurity:    Worry: Not on file    Inability: Not on file  . Transportation needs:    Medical: Not on file    Non-medical: Not on file  Tobacco Use  . Smoking status: Never Smoker  . Smokeless tobacco: Never Used  Substance and Sexual Activity  . Alcohol use: No  . Drug use: No  . Sexual activity: Yes    Partners: Male  Lifestyle  . Physical activity:    Days per week: Not on file    Minutes per session: Not on file  . Stress: Not on file  Relationships  . Social connections:    Talks on phone: Not on file    Gets together: Not on file    Attends religious service: Not on file    Active member of club or organization: Not on file    Attends meetings of clubs or organizations: Not on file    Relationship status: Not on file  . Intimate partner violence:    Fear of current or ex partner: Not on file    Emotionally abused: Not on file    Physically abused: Not on file    Forced sexual activity: Not on file  Other Topics Concern  . 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    No pets    Past Surgical History:  Procedure Laterality Date  . CESAREAN SECTION     05/06/14 and 01/05/16    Family History  Problem Relation Age of Onset  . Arthritis Mother        osteoarthritis  . Hyperlipidemia Father   . Diabetes Father        type 2 DM, he is obese  . Arthritis Maternal Grandmother   . Arthritis Paternal Grandmother   . Diabetes Paternal Grandmother   . Gestational diabetes Sister        overweight    Allergies  Allergen Reactions  . Prednisone Nausea And Vomiting  . Pseudoephedrine Other (See Comments)    "My heart races"    Current Outpatient Medications on File Prior to Visit  Medication Sig Dispense Refill  . albuterol (PROVENTIL HFA;VENTOLIN HFA) 108 (90 Base) MCG/ACT inhaler albuterol sulfate HFA 90 mcg/actuation aerosol inhaler  Inhale 2 puffs every 4 hours by inhalation route as needed.     No current facility-administered medications on file  prior to visit.     BP 108/66 (BP Location: Right Arm, Patient Position: Sitting, Cuff Size: Small)   Pulse 75   Temp 98.7 F (37.1 C) (Oral)   Resp 16   Ht  (1.676 m)   Wt 131 lb 12.8 oz (59.8 kg)   LMP 02/15/2018   SpO2 100%   BMI 21.27 kg/m        Objective:   Physical Exam  Constitutional: She appears well-developed and well-nourished.  Cardiovascular: Normal rate, regular rhythm and normal heart sounds.  No murmur heard. Pulmonary/Chest: Effort normal and breath sounds normal. No respiratory distress. She has no wheezes.  Psychiatric: She has a normal mood and affect. Her behavior is normal. Judgment and thought content normal.  Breast:  Bilateral breast exam is normal.  R axilla is without redness, swelling or palpable LAD. Very minimal tenderness. No palpable mass.        Assessment & Plan:  R axillary pain- ? Musculoskeletal pain. Trial of meloxicam. If symptoms not improved in 2 weeks I have asked her to contact me and we can send her for a breast ultrasound.

## 2018-03-29 ENCOUNTER — Telehealth: Payer: Self-pay | Admitting: *Deleted

## 2018-03-29 NOTE — Telephone Encounter (Signed)
Received request for Medical Records [place] [facility] Matt [place]Mitesh Desai, [facility] [place]; forwarded to [email or web address] for email/scan/SLS 06/12

## 2018-05-17 ENCOUNTER — Encounter: Payer: 59 | Admitting: Family

## 2018-10-25 IMAGING — US US ABDOMEN COMPLETE
1 series · 14 of 25 positions shown · non-contrast
Comparison: None.

CLINICAL DATA: Nausea vomiting and right upper quadrant pain for 2
days

EXAM:
ABDOMEN ULTRASOUND COMPLETE

[Series 1: us abdomen complete · 0.17mm/px · 14 of 89 slices shown]
[im 1/89]
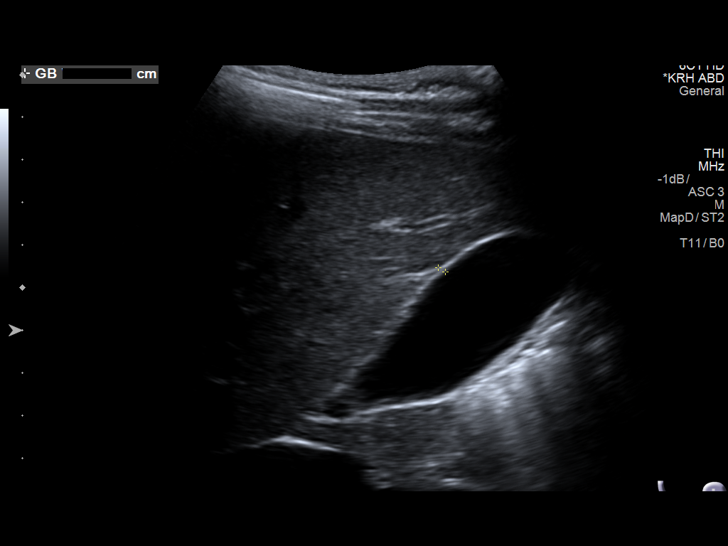
[im 8/89]
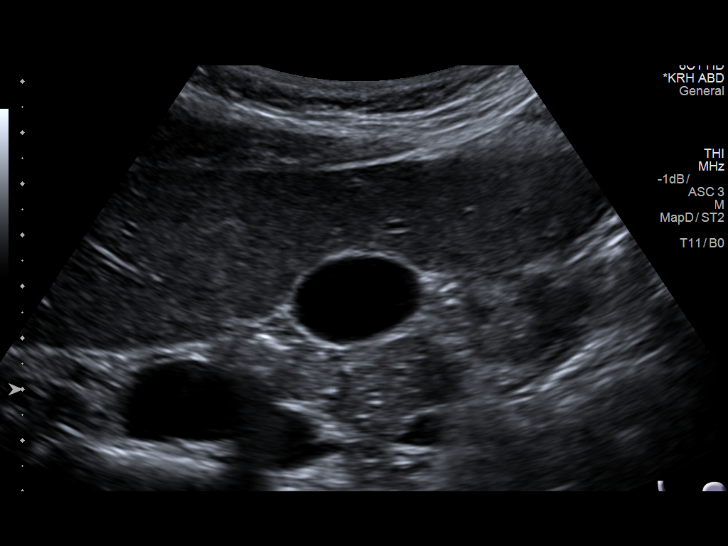
[im 15/89]
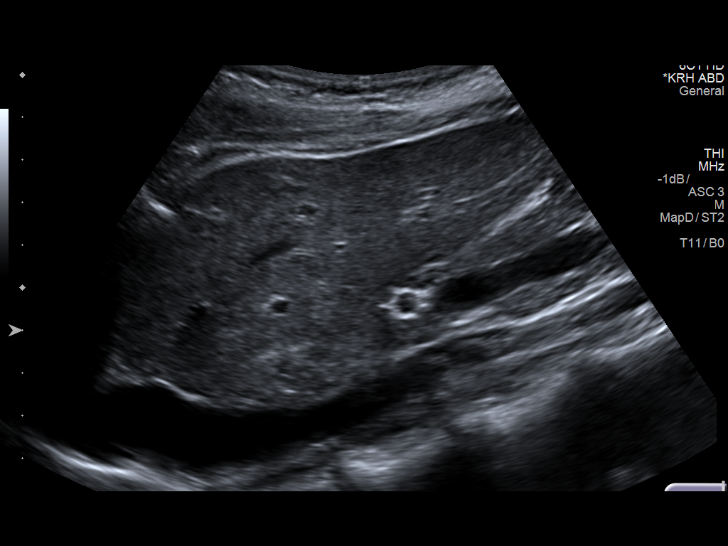
[im 23/89]
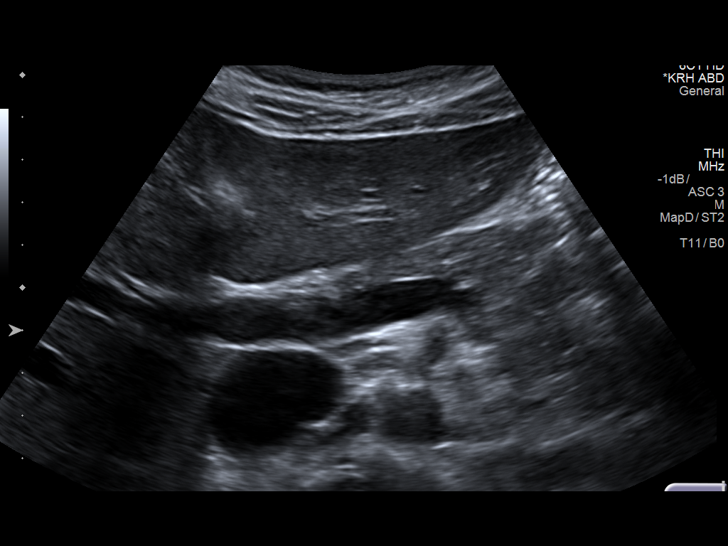
[im 30/89]
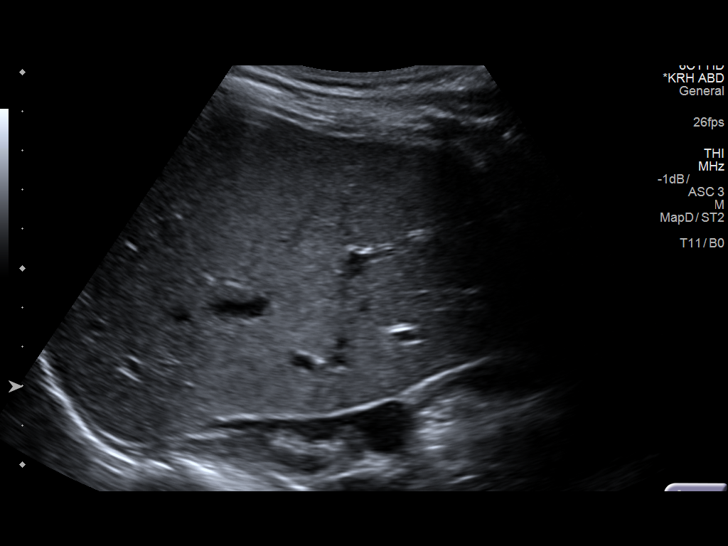
[im 34/89]
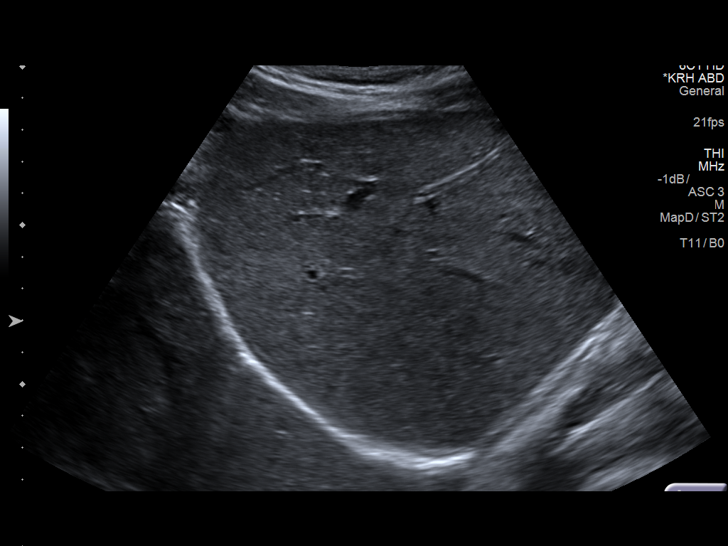
[im 41/89]
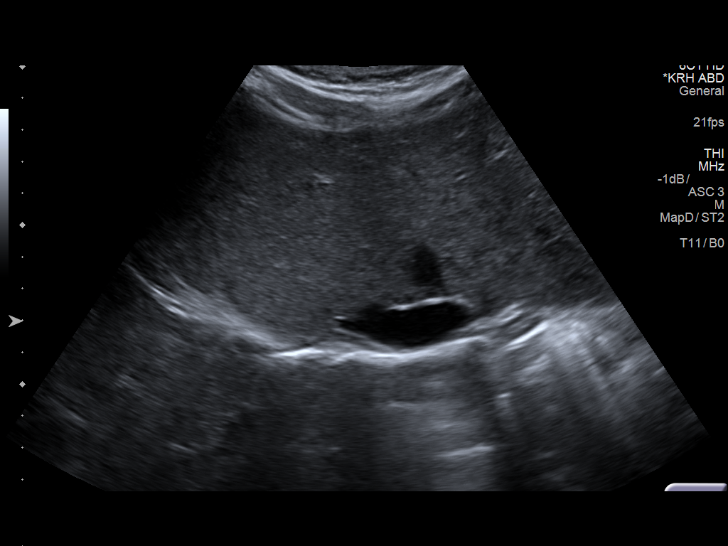
[im 48/89]
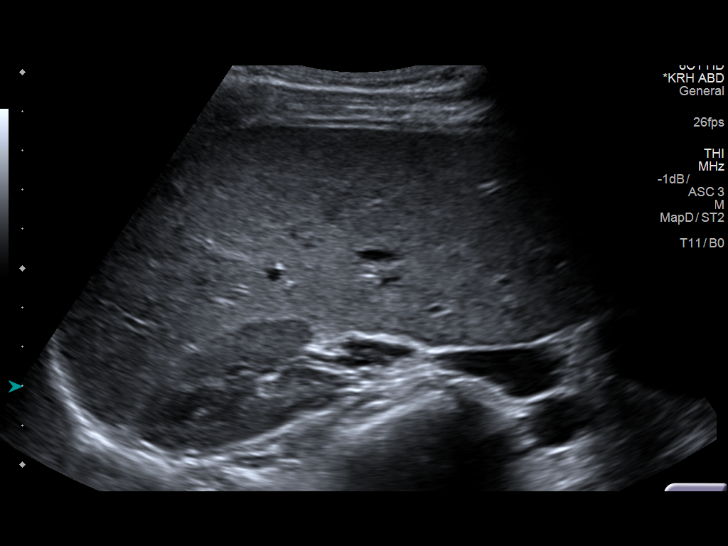
[im 56/89]
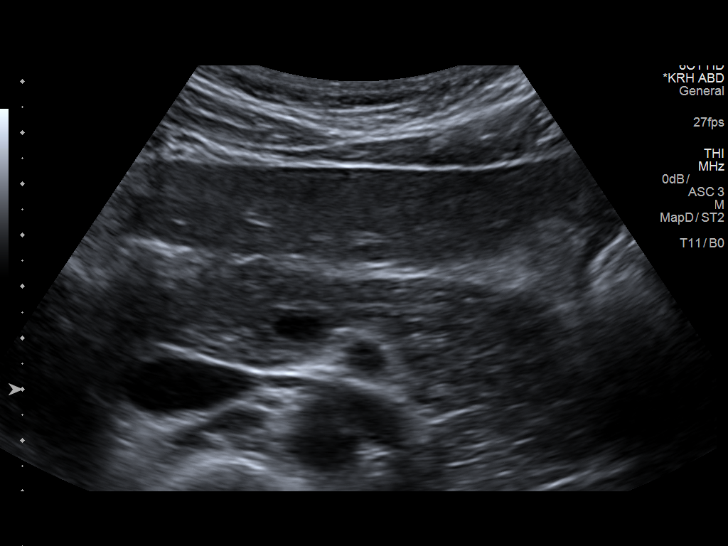
[im 59/89]
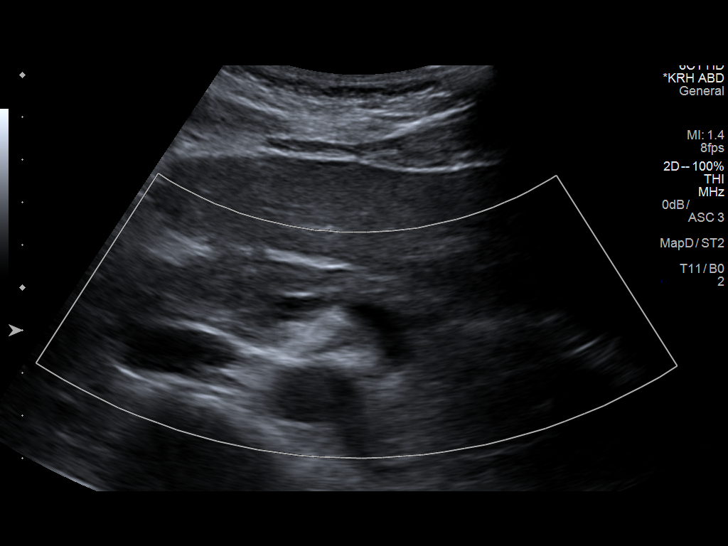
[im 67/89]
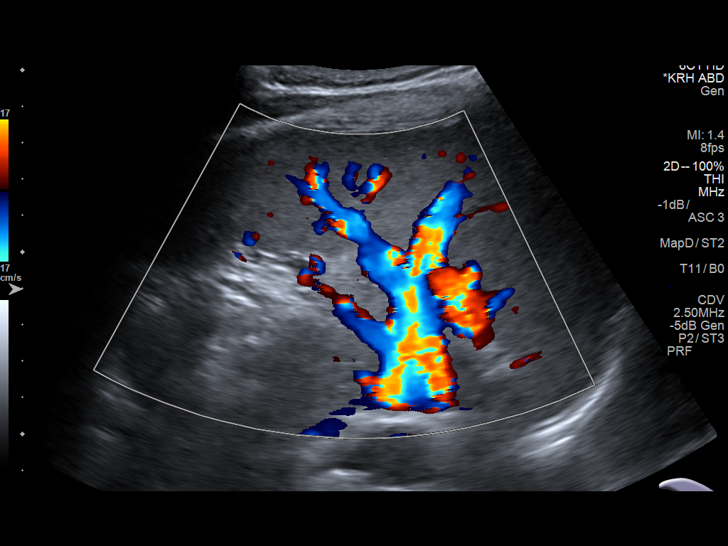
[im 74/89]
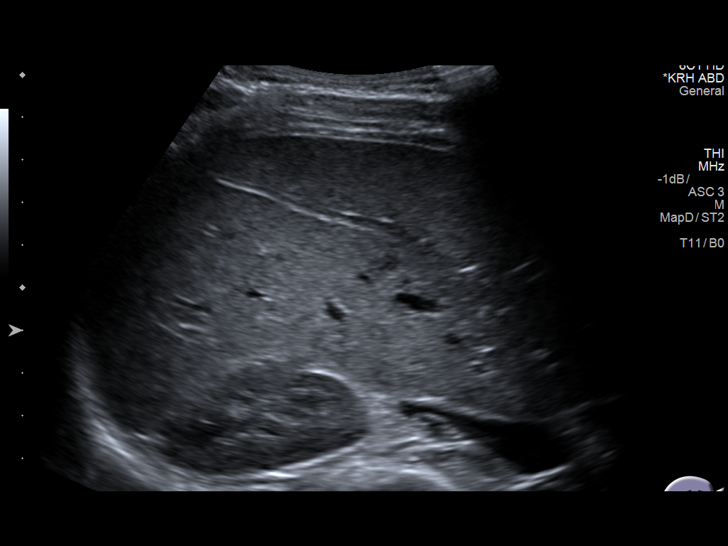
[im 81/89]
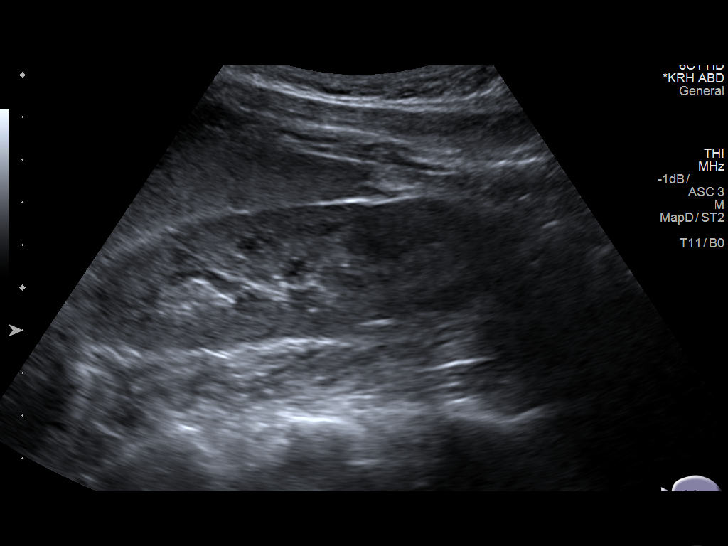
[im 89/89]
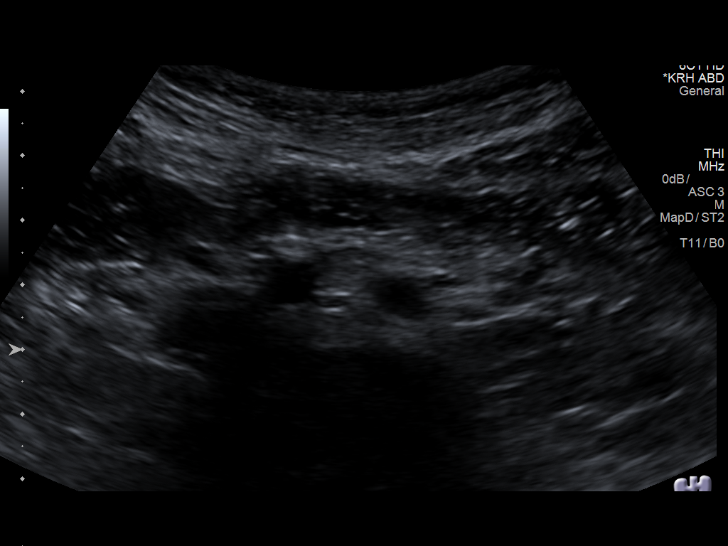

[14 of 25 positions shown; findings below may reference images not displayed]

FINDINGS: Gallbladder: No gallstones or wall thickening visualized. No
sonographic Murphy sign noted by sonographer.

Common bile duct: Diameter: 2.1 mm

Liver: No focal lesion identified. Within normal limits in
parenchymal echogenicity.

IVC: No abnormality visualized.

Pancreas: Visualized portion unremarkable.

Spleen: Enlarged at 13 cm with volume of 424 mL

Right Kidney: Length: 10.6 cm. Echogenicity within normal limits. No
mass or hydronephrosis visualized.

Left Kidney: Length: 10.3 cm. Echogenicity within normal limits. No
mass or hydronephrosis visualized.

Abdominal aorta: No aneurysm visualized.

Other findings: None.
IMPRESSION: 1. Enlarged spleen.
2. Otherwise negative abdominal ultrasound
